# Patient Record
Sex: Female | Born: 2005 | Hispanic: Yes | Marital: Single | Smoking: Never smoker
Health system: Southern US, Community
[De-identification: ages and names within clinical notes are randomized; demographics above are authoritative.]

## PROBLEM LIST (undated history)

## (undated) DIAGNOSIS — F419 Anxiety disorder, unspecified: Secondary | ICD-10-CM

## (undated) DIAGNOSIS — J45909 Unspecified asthma, uncomplicated: Secondary | ICD-10-CM

## (undated) DIAGNOSIS — F32A Depression, unspecified: Secondary | ICD-10-CM

## (undated) HISTORY — PX: TYMPANOSTOMY TUBE PLACEMENT: SHX32

---
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
## 2024-01-01 ENCOUNTER — Ambulatory Visit: Admitting: Nurse Practitioner

## 2024-02-01 ENCOUNTER — Ambulatory Visit (HOSPITAL_COMMUNITY)

## 2024-02-03 ENCOUNTER — Ambulatory Visit (HOSPITAL_COMMUNITY)
Admission: RE | Admit: 2024-02-03 | Discharge: 2024-02-03 | Disposition: A | Discharge status: Home / Self Care | Source: Ambulatory Visit | Attending: Emergency Medicine | Admitting: Emergency Medicine

## 2024-02-03 ENCOUNTER — Encounter (HOSPITAL_COMMUNITY): Payer: Self-pay

## 2024-02-03 VITALS — BP 102/69 | HR 73 | Temp 97.9°F | Resp 16

## 2024-02-03 DIAGNOSIS — Z113 Encounter for screening for infections with a predominantly sexual mode of transmission: Secondary | ICD-10-CM | POA: Diagnosis not present

## 2024-02-03 DIAGNOSIS — Z3202 Encounter for pregnancy test, result negative: Secondary | ICD-10-CM | POA: Diagnosis not present

## 2024-02-03 DIAGNOSIS — N898 Other specified noninflammatory disorders of vagina: Secondary | ICD-10-CM | POA: Diagnosis present

## 2024-02-03 LAB — POCT URINALYSIS DIP (MANUAL ENTRY)
Bilirubin, UA: NEGATIVE
Glucose, UA: NEGATIVE mg/dL
Ketones, POC UA: NEGATIVE mg/dL
Nitrite, UA: NEGATIVE
Protein Ur, POC: 30 mg/dL — AB
Spec Grav, UA: 1.025 (ref 1.010–1.025)
Urobilinogen, UA: 0.2 U/dL
pH, UA: 5 (ref 5.0–8.0)

## 2024-02-03 LAB — POCT URINE PREGNANCY: Preg Test, Ur: NEGATIVE

## 2024-02-03 MED ORDER — DOXYCYCLINE HYCLATE 100 MG PO TABS: 100.0000 mg | ORAL_TABLET | Freq: Two times a day (BID) | ORAL | 0 refills | Status: AC

## 2024-02-03 MED ORDER — CEFTRIAXONE SODIUM 500 MG IJ SOLR
INTRAMUSCULAR | Status: AC
  Filled 2024-02-03: qty 500

## 2024-02-03 MED ORDER — CEFTRIAXONE SODIUM 500 MG IJ SOLR
500.0000 mg | INTRAMUSCULAR | Status: DC
  Administered 2024-02-03: 500 mg via INTRAMUSCULAR

## 2024-02-03 MED ORDER — LIDOCAINE HCL (PF) 1 % IJ SOLN
INTRAMUSCULAR | Status: AC
  Filled 2024-02-03: qty 2

## 2024-02-03 NOTE — ED Provider Notes (Addendum)
 [facility]    CSN: [id number] Arrival date & time: 02/03/24  1640      History   Chief Complaint Chief Complaint  Patient presents with   Vaginal Itching    Just got off my period & I have itching going on with a little smell to it - Entered by patient    HPI Abigail Buckley is a 18 y.o. female.   Patient presents to clinic over concern of malodorous vaginal discharge that is yellow in color.  Noticed vaginal odor that started while on her menstrual cycle and then continued despite menstrual cycle finishing.  She has had abdominal discomfort in the lower abdomen and vaginal irritation with itching.  Feels like the insides are rubbing together and is causing the irritation.  She is sexually active with a singular partner, unprotected.  Is not sure if she is his only sexual partner.  Would like screening for STIs, including HIV and syphilis.  Has an IUD.  Has dysuria, this is ongoing.  Denies hematuria.  Without nausea or vomiting.  Denies fever.  The history is provided by the patient and medical records.  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 Authorizing Provider  doxycycline  (VIBRA -TABS) 100 MG tablet Take 1 tablet (100 mg total) by mouth 2 (two) times daily for 7 days. 02/03/24 02/10/24 Yes Dreama, Daurice Ovando  N, FNP    Family History History reviewed. No pertinent family history.  Social History Social History   Tobacco Use   Smoking status: Never    Passive exposure: Current   Smokeless tobacco: Never  Vaping Use   Vaping status: Every Day  Substance Use Topics   Alcohol use: Yes    Alcohol/week: 5.0 standard drinks of alcohol    Types: 3 Glasses of wine, 2 Shots of liquor per week   Drug use: Yes    Types: Marijuana    Comment: 01/05/2021     Allergies   Patient has no known allergies.   Review of Systems Review of Systems  Per HPI  Physical Exam Triage Vital Signs ED Triage Vitals  Encounter Vitals Group     BP 02/03/24 1701 102/69     Girls Systolic BP Percentile --      Girls Diastolic BP Percentile --      Boys Systolic BP  Percentile --      Boys Diastolic BP Percentile --      Pulse Rate 02/03/24 1701 73     Resp 02/03/24 1701 16     Temp 02/03/24 1701 97.9 F (36.6 C)     Temp Source 02/03/24 1701 Oral     SpO2 02/03/24 1701 95 %     Weight --      Height --      Head Circumference --      Peak Flow --      Pain Score 02/03/24 1702 7     Pain Loc --      Pain Education --      Exclude from Growth Chart --    No data found.  Updated Vital Signs BP 102/69 (BP Location: Left Arm)   Pulse 73   Temp 97.9 F (36.6 C) (Oral)   Resp 16   LMP 01/28/2024 (Approximate)   SpO2 95%   Visual Acuity Right Eye Distance:   Left Eye Distance:   Bilateral Distance:    Right Eye Near:   Left Eye Near:    Bilateral Near:     Physical Exam Vitals and nursing note reviewed.  Constitutional:      Appearance: Normal appearance.  HENT:     Head: Normocephalic and atraumatic.     Right Ear: External ear normal.     Left Ear: External ear normal.      Nose: Nose normal.     Mouth/Throat:     Mouth: Mucous membranes are moist.  Eyes:     Conjunctiva/sclera: Conjunctivae normal.  Cardiovascular:     Rate and Rhythm: Normal rate.  Pulmonary:     Effort: Pulmonary effort is normal.  Abdominal:     General: Abdomen is flat. Bowel sounds are normal.     Palpations: Abdomen is soft.     Tenderness: There is no abdominal tenderness. There is no guarding or rebound.  Musculoskeletal:        General: Normal range of motion.  Skin:    General: Skin is warm and dry.  Neurological:     General: No focal deficit present.     Mental Status: She is alert and oriented to person, place, and time.  Psychiatric:        Mood and Affect: Mood normal.        Behavior: Behavior normal.      UC Treatments / Results  Labs (all labs ordered are listed, but only abnormal results are displayed) Labs Reviewed  POCT URINALYSIS DIP (MANUAL ENTRY) - Abnormal; Notable for the following components:      Result Value   Blood, UA moderate (*)    Protein Ur, POC =30 (*)    Leukocytes, UA Trace (*)    All other components within normal limits  URINE CULTURE  HIV ANTIBODY (ROUTINE TESTING W REFLEX)  RPR  POCT URINE PREGNANCY  CERVICOVAGINAL ANCILLARY ONLY    EKG   Radiology No results found.  Procedures Procedures (including critical care time)  Medications Ordered in UC Medications  cefTRIAXone (ROCEPHIN) injection 500 mg (has no administration in time range)    Initial Impression / Assessment and Plan / UC Course  I have reviewed the triage vital signs and the nursing notes.  Pertinent labs & imaging results that were available during my care of the patient were reviewed by me and considered in my medical decision making (see chart for details).  Vitals and triage reviewed,  patient is hemodynamically stable.  Abdomen soft and nontender with active bowel sounds, low concern for acute abdomen.  Urine pregnancy negative.  UA with  moderate blood and trace leuks, will send for culture.  Vaginal discharge that is malodorous and yellow, discussed different treatment options and abstaining from treatment until we know cause.  Patient opted to move forward with empiric treatment for gonorrhea and chlamydia.  IM Rocephin given in clinic and oral doxycycline  sent to pharmacy.  Staff will contact if treatment modification is needed based on results.  Plan of care, follow-up care return precautions given, no questions at this time.    Final Clinical Impressions(s) / UC Diagnoses   Final diagnoses:  Vaginal discharge  Encounter for screening examination for sexually transmitted infection     Discharge Instructions      It is unclear what is causing your symptoms.  You have been tested for vaginal infections and sexually transmitted infections.  You have been empirically treated for gonorrhea with Rocephin.  We are treating you for chlamydia with doxycycline  antibiotic sent to your pharmacy.  Take these twice daily with food for the next 7 days.  Abstain from intercourse until all results have been received.  If you test positive for a sexually transmitted infection please notify your partner which ones to they can also receive treatment.    ED Prescriptions     Medication Sig Dispense Auth. Provider   doxycycline  (VIBRA -TABS) 100 MG tablet Take 1 tablet (100 mg total) by mouth 2 (two) times daily for 7 days. 14 tablet Dreama, Braelin Costlow  N, FNP      PDMP not reviewed this encounter.       Dreama, Vasili Fok  N, FNP 02/03/24 1744

## 2024-02-03 NOTE — ED Triage Notes (Signed)
 Patient here today with c/o vaginal itching, odor, and lower abd pain X 1 week.

## 2024-02-03 NOTE — Discharge Instructions (Addendum)
 It is unclear what is causing your symptoms.  You have been tested for vaginal infections and sexually transmitted infections.  You have been empirically treated for gonorrhea with Rocephin.  We are treating you for chlamydia with doxycycline  antibiotic sent to your pharmacy.  Take these twice daily with food for the next 7 days.  Abstain from intercourse until all results have been received.  If you test positive for a sexually transmitted infection please notify your partner which ones to they can also receive treatment.

## 2024-02-04 LAB — CERVICOVAGINAL ANCILLARY ONLY
Bacterial Vaginitis (gardnerella): POSITIVE — AB
Candida Glabrata: NEGATIVE
Candida Vaginitis: NEGATIVE
Chlamydia: NEGATIVE
Comment: NEGATIVE
Comment: NEGATIVE
Comment: NEGATIVE
Comment: NEGATIVE
Comment: NEGATIVE
Comment: NORMAL
Neisseria Gonorrhea: NEGATIVE
Trichomonas: NEGATIVE

## 2024-02-04 LAB — RPR: RPR Ser Ql: NONREACTIVE

## 2024-02-04 LAB — URINE CULTURE: Culture: NO GROWTH

## 2024-02-05 ENCOUNTER — Ambulatory Visit (HOSPITAL_COMMUNITY): Payer: Self-pay

## 2024-02-05 ENCOUNTER — Other Ambulatory Visit: Payer: Self-pay | Admitting: Medical Genetics

## 2024-02-05 DIAGNOSIS — Z006 Encounter for examination for normal comparison and control in clinical research program: Secondary | ICD-10-CM

## 2024-02-05 LAB — MISC LABCORP TEST (SEND OUT): Labcorp test code: 83935

## 2024-02-05 MED ORDER — METRONIDAZOLE 500 MG PO TABS: 500.0000 mg | ORAL_TABLET | Freq: Two times a day (BID) | ORAL | 0 refills | Status: AC

## 2024-04-08 ENCOUNTER — Ambulatory Visit (HOSPITAL_COMMUNITY): Admission: RE | Admit: 2024-04-08 | Discharge: 2024-04-08 | Disposition: A | Discharge status: Home / Self Care

## 2024-04-08 VITALS — BP 138/70 | HR 73 | Temp 98.4°F | Resp 18

## 2024-04-08 DIAGNOSIS — Z113 Encounter for screening for infections with a predominantly sexual mode of transmission: Secondary | ICD-10-CM

## 2024-04-08 DIAGNOSIS — R1030 Lower abdominal pain, unspecified: Secondary | ICD-10-CM | POA: Diagnosis not present

## 2024-04-08 DIAGNOSIS — Z3202 Encounter for pregnancy test, result negative: Secondary | ICD-10-CM

## 2024-04-08 LAB — POCT URINE DIPSTICK
Bilirubin, UA: NEGATIVE
Glucose, UA: NEGATIVE mg/dL
Ketones, POC UA: NEGATIVE mg/dL
Leukocytes, UA: NEGATIVE
Nitrite, UA: NEGATIVE
Protein Ur, POC: >=300 mg/dL — AB
Spec Grav, UA: 1.025 (ref 1.010–1.025)
Urobilinogen, UA: 1 U/dL
pH, UA: 6.5 (ref 5.0–8.0)

## 2024-04-08 LAB — HIV ANTIBODY (ROUTINE TESTING W REFLEX): HIV Screen 4th Generation wRfx: NONREACTIVE

## 2024-04-08 LAB — POCT URINE PREGNANCY: Preg Test, Ur: NEGATIVE

## 2024-04-08 NOTE — ED Provider Notes (Signed)
 [facility]  Note:  This document was prepared using Conservation officer, historic buildings and may include unintentional dictation errors.  MRN: [id number] DOB: 02-Apr-2006  Subjective:   Abigail Buckley is a 18 y.o. female presenting for ongoing abdominal cramping following her menstrual cycle, nausea, bumps in the vaginal area.  Patient would like STD screening with blood work today.  Patient denies any known exposure to any STDs.  Patient states that she does not feel any sensation during sexual intercourse which is concerning to her.  Patient denies having a primary care for follow-up or OB/GYN.  Patient is currently on IUD.  No fever, shortness of breath, chest pain, weakness, dizziness, severe abdominal pain, flank pain, dysuria, increased urinary frequency.  Current Medications[1]   Allergies[2]  Past Medical History:  Diagnosis Date   Anxiety    Asthma    Depression      Past Surgical History:  Procedure Laterality Date   IUD INSERTION  10/21/2021   TYMPANOSTOMY TUBE PLACEMENT      No family history on file.  Social History[3]  ROS Refer to HPI for ROS details.  Objective:    Vitals: BP 138/70 (BP Location: Left Arm)   Pulse 73   Temp 98.4 F (36.9 C) (Oral)   Resp 18   LMP 03/28/2024 (Approximate)   SpO2 98%   Physical Exam Vitals and nursing note reviewed.  Constitutional:      General: She is not in acute distress.    Appearance: Normal appearance. She is well-developed. She is not ill-appearing or toxic-appearing.  HENT:     Head: Normocephalic and atraumatic.  Cardiovascular:     Rate and Rhythm: Normal rate.  Pulmonary:     Effort: Pulmonary effort is normal. No respiratory distress.     Breath sounds: No stridor. No wheezing.  Abdominal:     Tenderness: There is no abdominal tenderness. There is no right CVA tenderness or left CVA tenderness.  Skin:    General: Skin is warm and dry.  Neurological:     General: No focal  deficit present.     Mental Status: She is alert and oriented to person, place, and time.  Psychiatric:        Mood and Affect: Mood normal.        Behavior: Behavior normal.     Procedures  Results for orders placed or performed during the hospital encounter of 04/08/24 (from the past 24 hours)  POCT urine pregnancy     Status: Normal   Collection Time: 04/08/24  3:57 PM  Result Value Ref Range   Preg Test, Ur Negative Negative  POC Urinalysis Dipstick     Status: Abnormal   Collection Time: 04/08/24  3:57 PM  Result Value Ref Range   Color, UA yellow yellow   Clarity, UA hazy (A) clear   Glucose, UA negative negative mg/dL   Bilirubin, UA negative negative   Ketones, POC UA negative negative mg/dL   Spec Grav, UA 8.974 8.989 - 1.025   Blood, UA large (A) negative   pH, UA 6.5 5.0 - 8.0   Protein Ur, POC >=300 (A) negative mg/dL   Urobilinogen, UA 1.0 0.2 or 1.0 E.U./dL   Nitrite, UA Negative Negative   Leukocytes, UA Negative Negative    Assessment and Plan :     Discharge Instructions       1. Screen for STD (sexually transmitted disease) (Primary) - Cervicovaginal swab collected in UC and sent to  lab for further testing results should be available in 2 to 3 days - HIV Antibody (routine testing w rflx) - RPR - All blood testing collected in UC should be resulted within 24 to 48 hours.  Any abnormal results and testing will be conveyed to patient and appropriate treatment will be provided.  2. Lower abdominal pain - POCT urine pregnancy completed in UC is negative for pregnancy - POC Urinalysis Dipstick completed in UC is positive for large blood, negative for nitrite and leukocytes, these findings are not strongly indicative of urinary infection. - Urine Culture collected in UC and sent to lab for further testing results should be available in 2 to 3 days. - Follow-up with gynecology referral for further evaluation and treatment of frequent bacterial vaginosis  infections as well as other vaginal issues.  -Continue to monitor symptoms for any change in severity if there is any escalation of current symptoms or development of new symptoms follow-up in ER for further evaluation and management.      Mikenna Bunkley B Viren Lebeau    [1] No current facility-administered medications for this encounter. No current outpatient medications on file. [2] No Known Allergies [3]  Social History Tobacco Use   Smoking status: Never    Passive exposure: Current   Smokeless tobacco: Never  Vaping Use   Vaping status: Every Day  Substance Use Topics   Alcohol use: Yes    Alcohol/week: 5.0 standard drinks of alcohol    Types: 3 Glasses of wine, 2 Shots of liquor per week   Drug use: Yes    Types: Marijuana    Comment: 01/05/2021     Aurea Goodell B, NP 04/08/24 1634

## 2024-04-08 NOTE — ED Triage Notes (Signed)
 Patient is here for lower abdominal cramping, nausea, and bumps on her vagina.  Patient states the vaginal bumps have been there for several weeks.STD- testing with blood work today.   Patient is concerned when she has sexual intercourse she does not feel sensation.  Will help set patient up for a PCP.

## 2024-04-08 NOTE — Discharge Instructions (Signed)
°  1. Screen for STD (sexually transmitted disease) (Primary) - Cervicovaginal swab collected in UC and sent to lab for further testing results should be available in 2 to 3 days - HIV Antibody (routine testing w rflx) - RPR - All blood testing collected in UC should be resulted within 24 to 48 hours.  Any abnormal results and testing will be conveyed to patient and appropriate treatment will be provided.  2. Lower abdominal pain - POCT urine pregnancy completed in UC is negative for pregnancy - POC Urinalysis Dipstick completed in UC is positive for large blood, negative for nitrite and leukocytes, these findings are not strongly indicative of urinary infection. - Urine Culture collected in UC and sent to lab for further testing results should be available in 2 to 3 days. - Follow-up with gynecology referral for further evaluation and treatment of frequent bacterial vaginosis infections as well as other vaginal issues.  -Continue to monitor symptoms for any change in severity if there is any escalation of current symptoms or development of new symptoms follow-up in ER for further evaluation and management.

## 2024-04-09 LAB — URINE CULTURE
Culture: 20000 — AB
Special Requests: NORMAL

## 2024-04-09 LAB — SYPHILIS: RPR W/REFLEX TO RPR TITER AND TREPONEMAL ANTIBODIES, TRADITIONAL SCREENING AND DIAGNOSIS ALGORITHM: RPR Ser Ql: NONREACTIVE

## 2024-04-11 ENCOUNTER — Ambulatory Visit (HOSPITAL_COMMUNITY): Payer: Self-pay

## 2024-04-11 LAB — CERVICOVAGINAL ANCILLARY ONLY
Bacterial Vaginitis (gardnerella): NEGATIVE
Candida Glabrata: NEGATIVE
Candida Vaginitis: NEGATIVE
Chlamydia: NEGATIVE
Comment: NEGATIVE
Comment: NEGATIVE
Comment: NEGATIVE
Comment: NEGATIVE
Comment: NEGATIVE
Comment: NORMAL
Neisseria Gonorrhea: NEGATIVE
Trichomonas: POSITIVE — AB

## 2024-04-11 MED ORDER — METRONIDAZOLE 500 MG PO TABS: 500.0000 mg | ORAL_TABLET | Freq: Two times a day (BID) | ORAL | 0 refills | Status: AC

## 2024-05-17 ENCOUNTER — Telehealth: Payer: Self-pay

## 2024-05-17 NOTE — Telephone Encounter (Signed)
 Copied from CRM #[id number]. Topic: Clinical - Request for Lab/Test Order >> May 17, 2024 11:44 AM Abigail Buckley wrote: Reason for CRM: Pt called requesting to speak to the clinic regarding vaccines that she needs. Also she needs to schedule an MRI  Best contact: [phone]  Tried to contact patient back. No answer and voicemail was full.
